# Patient Record
Sex: Male | Born: 1945 | Race: White | Hispanic: No | Marital: Married | Smoking: Never smoker
Health system: Southern US, Community
[De-identification: ages and names within clinical notes are randomized; demographics above are authoritative.]

## PROBLEM LIST (undated history)

## (undated) DIAGNOSIS — N200 Calculus of kidney: Secondary | ICD-10-CM

## (undated) DIAGNOSIS — M75100 Unspecified rotator cuff tear or rupture of unspecified shoulder, not specified as traumatic: Secondary | ICD-10-CM

## (undated) DIAGNOSIS — C8190 Hodgkin lymphoma, unspecified, unspecified site: Secondary | ICD-10-CM

---

## 2007-11-08 ENCOUNTER — Ambulatory Visit: Payer: Self-pay | Admitting: Gastroenterology

## 2009-05-12 ENCOUNTER — Ambulatory Visit: Payer: Self-pay | Admitting: Ophthalmology

## 2009-05-27 ENCOUNTER — Other Ambulatory Visit: Payer: Self-pay | Admitting: Unknown Physician Specialty

## 2010-06-05 ENCOUNTER — Ambulatory Visit: Payer: Self-pay | Admitting: Internal Medicine

## 2010-06-13 ENCOUNTER — Ambulatory Visit: Payer: Self-pay | Admitting: Urology

## 2010-06-25 ENCOUNTER — Inpatient Hospital Stay: Payer: Self-pay | Admitting: Internal Medicine

## 2011-04-13 ENCOUNTER — Ambulatory Visit: Payer: Self-pay | Admitting: Gastroenterology

## 2011-04-14 ENCOUNTER — Ambulatory Visit: Payer: Self-pay | Admitting: Surgery

## 2011-04-18 LAB — PATHOLOGY REPORT

## 2012-04-06 IMAGING — US ABDOMEN ULTRASOUND
1 series · 17 of 25 positions shown · non-contrast
Comparison: none

REASON FOR EXAM: ruq and renal
COMMENTS:   May transport without cardiac monitor

PROCEDURE:     US  - US ABDOMEN GENERAL SURVEY  - June 25, 2010  [DATE]
RESULT:     Comparison: None
TECHNIQUE: Multiple gray-scale and color-flow Doppler images of the abdomen
are presented for review.

[Series 1: abdomen ultrasound · 17 of 78 slices shown]
[im 1/78]
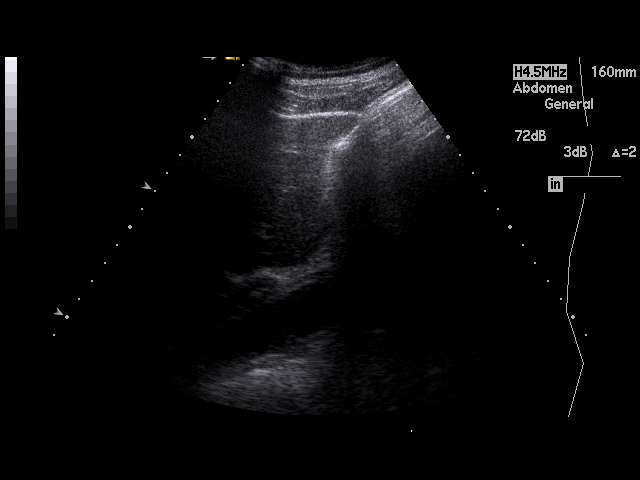
[im 7/78]
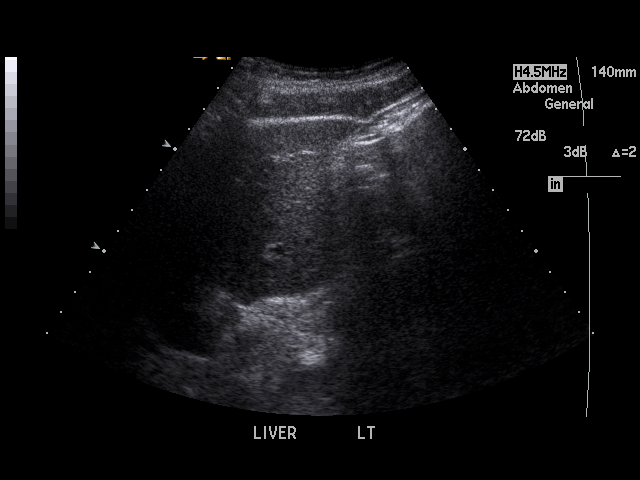
[im 10/78]
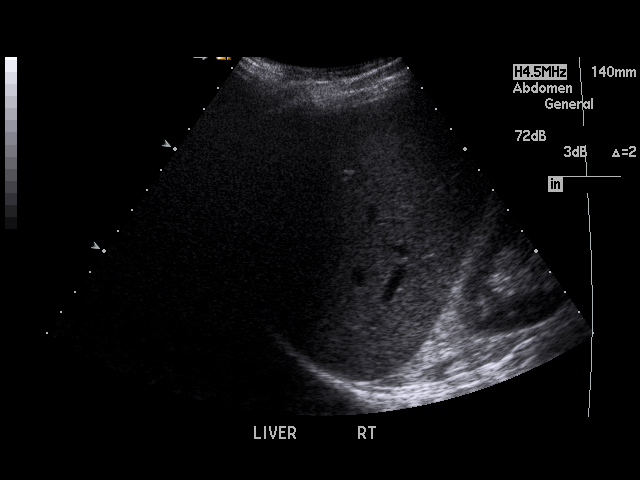
[im 17/78]
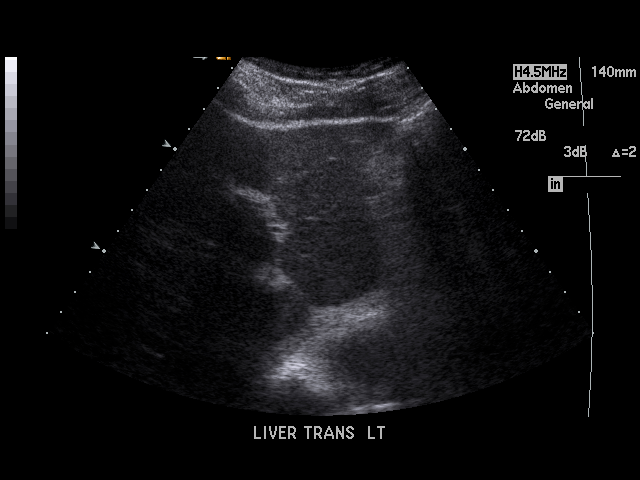
[im 20/78]
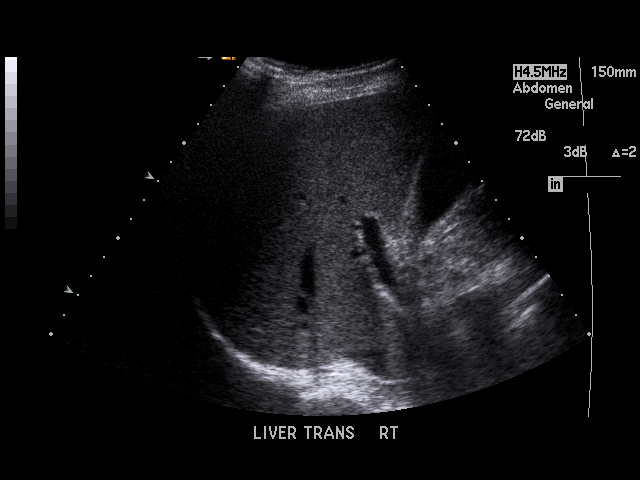
[im 26/78]
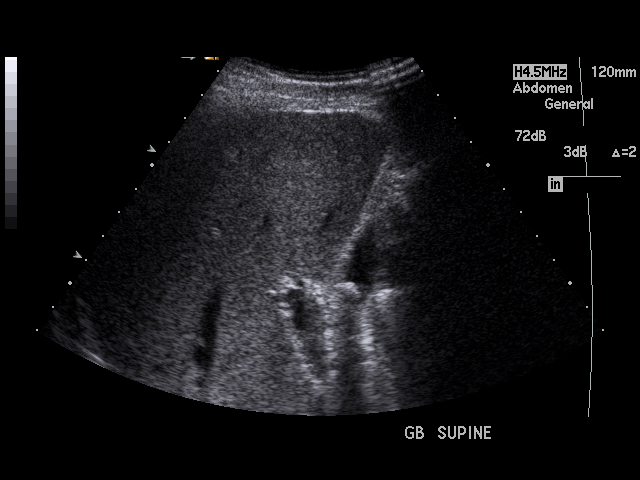
[im 29/78]
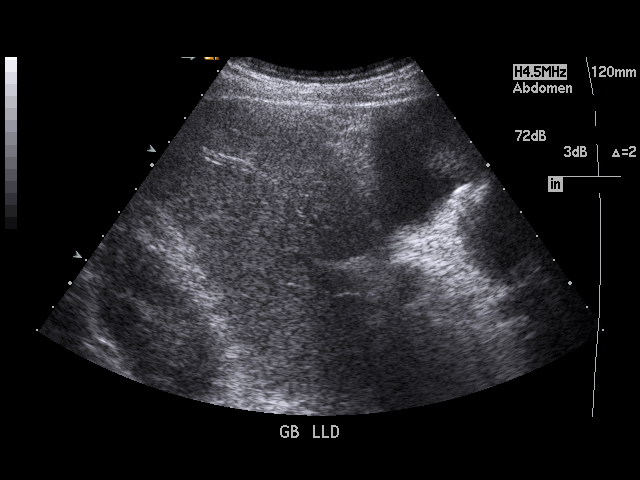
[im 36/78]
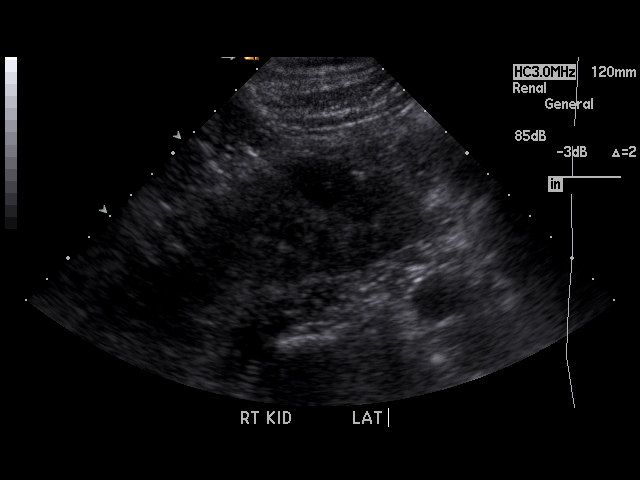
[im 39/78]
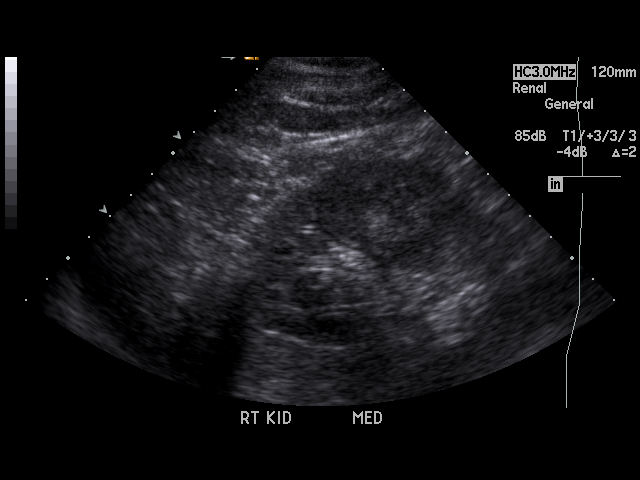
[im 42/78]
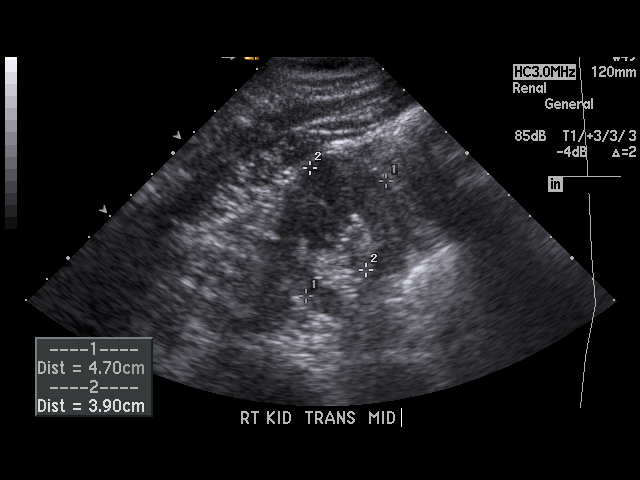
[im 49/78]
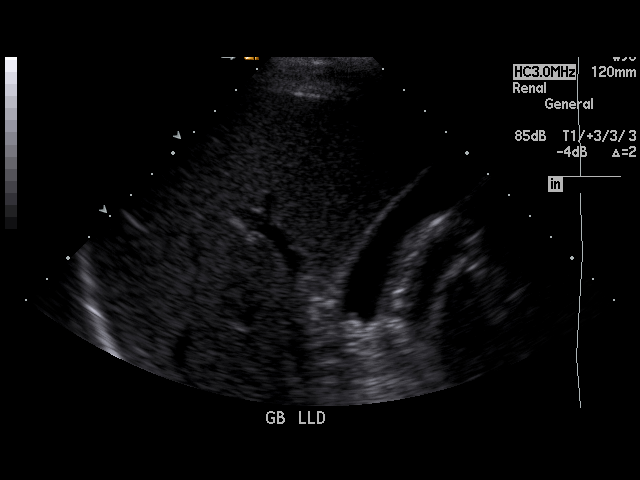
[im 52/78]
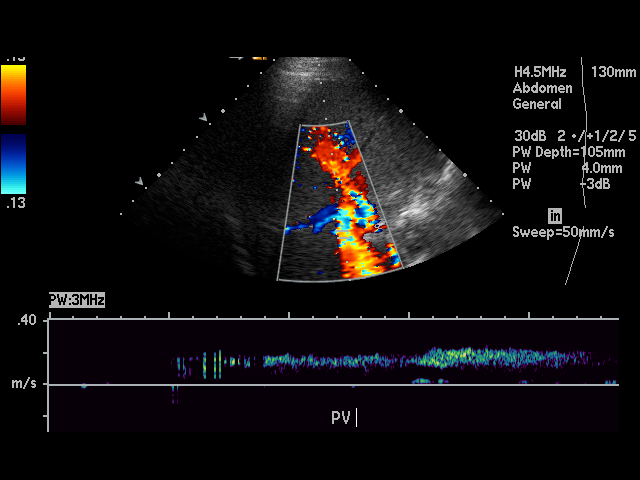
[im 58/78]
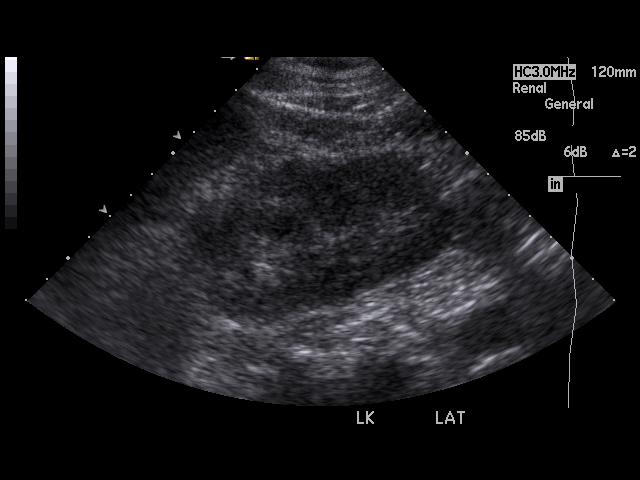
[im 61/78]
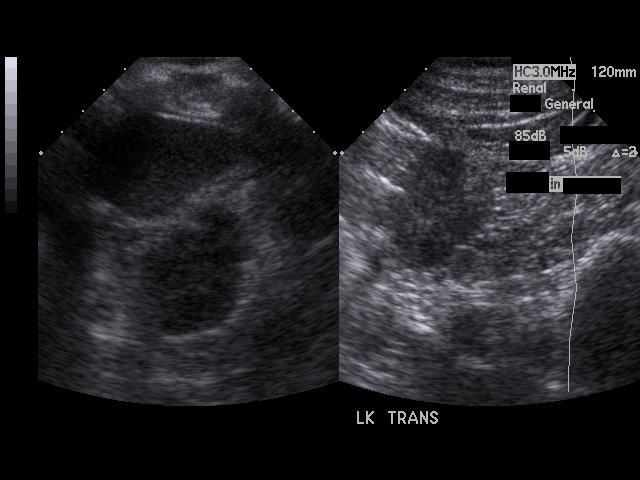
[im 68/78]
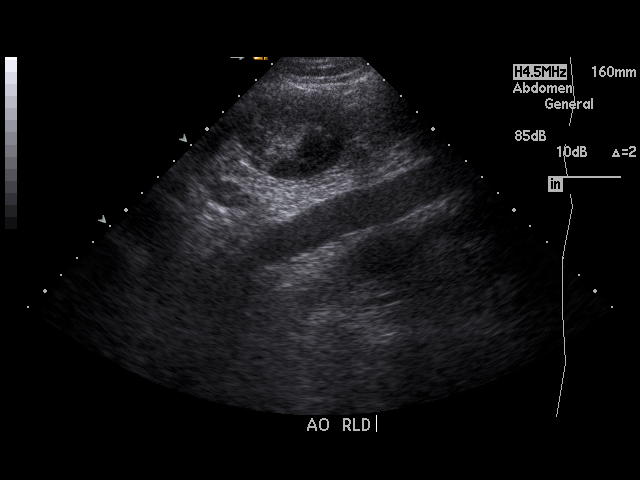
[im 71/78]
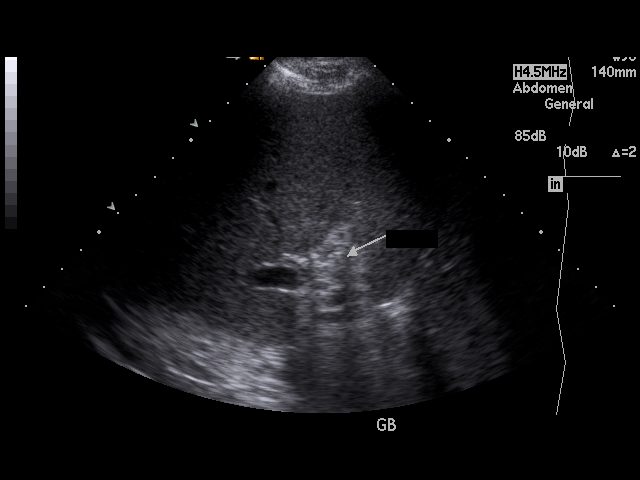
[im 78/78]
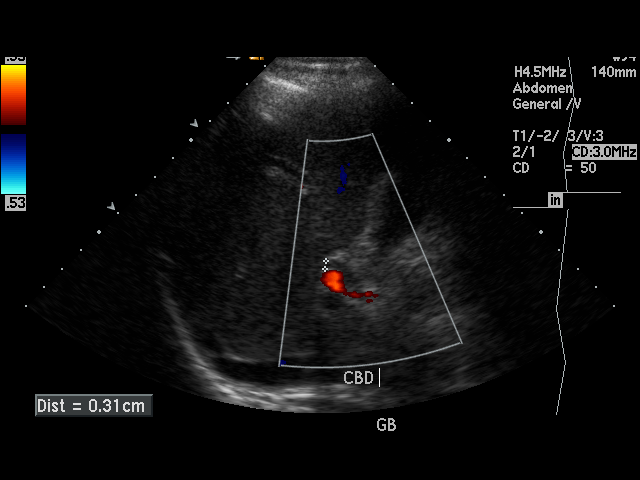

[17 of 25 positions shown; findings below may reference images not displayed]

FINDINGS: Visualized portions of the liver demonstrate normal echogenicity and normal
contours. The liver is without evidence of a focal hepatic lesion.

There are cholelithiasis. There is a nonmobile gallstone in the gallbladder
neck. There is no intra- or extrahepatic biliary ductal dilatation. The
common duct measures 4.3 mm in maximal diameter. There is no gallbladder
wall thickening, pericholecystic fluid, or sonographic Murphy's sign.

The visualized portion of the pancreas is normal in echogenicity. The spleen
is unremarkable. Bilateral kidneys are normal in echogenicity and size. The
right kidney measures 9.4 x 4.7 x 3.9 cm. The left kidney measures 10.7 x
5.3 x 4.3 cm. There is no obstructive uropathy. There is a nonobstructing
right renal calculus. There is a small 7 mm anechoic mass in the left kidney
likely resenting a small cyst. The abdominal aorta and IVC are unremarkable.
IMPRESSION: 1. Cholelithiasis without sonographic evidence of acute cholecystitis. There
is a nonmobile gallstone in the gallbladder neck.

2. Nonobstructing right nephrolithiasis.

## 2012-04-06 IMAGING — CT CT HEAD WITHOUT CONTRAST
2 series · 16 of 30 positions shown, 20 images · non-contrast
Comparison: none

REASON FOR EXAM: pain with moving eyes
COMMENTS:

PROCEDURE:     CT  - CT HEAD WITHOUT CONTRAST  - June 25, 2010 [DATE]
RESULT:     Comparison:  None
TECHNIQUE: Multiple axial images from the foramen magnum to the vertex were
obtained without IV contrast.

[Series 2: without · axial · non-contrast · 0.45mm/px · z∈[+492,+622]mm · 13 of 32 slices shown, 17 images]
[im 3/32  brain]
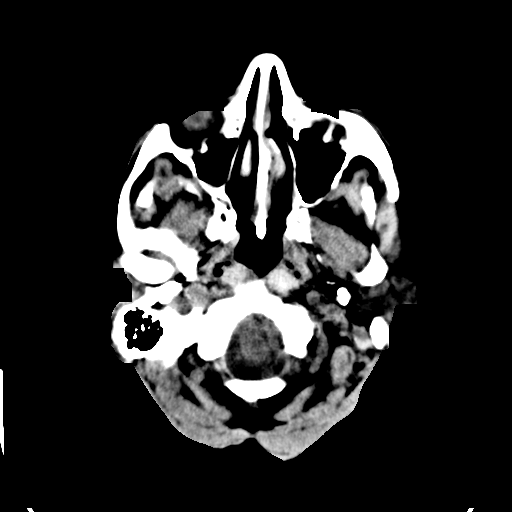
[im 3/32  bone]
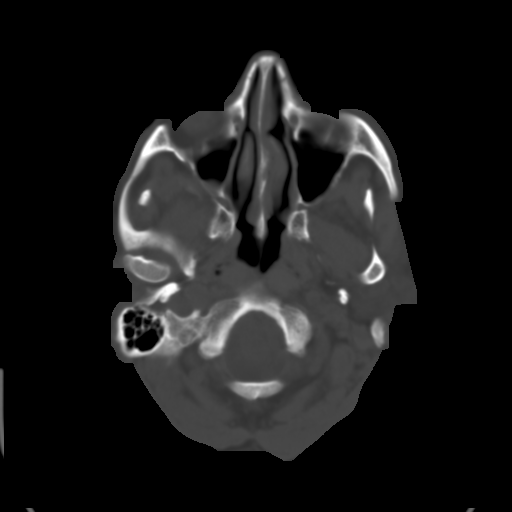
[im 5/32  brain]
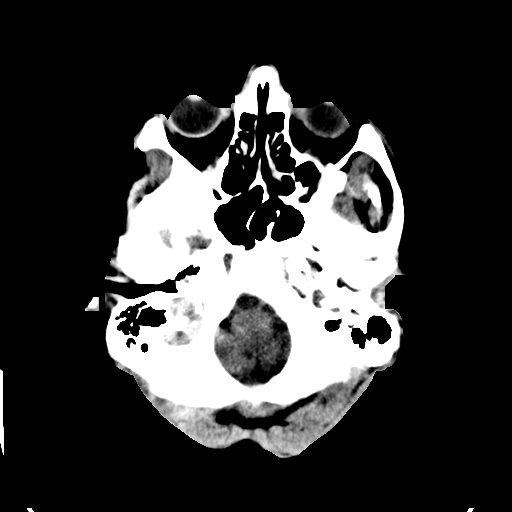
[im 7/32  brain]
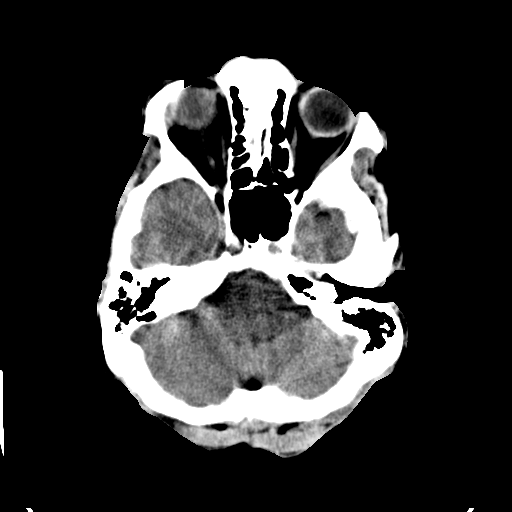
[im 9/32  brain]
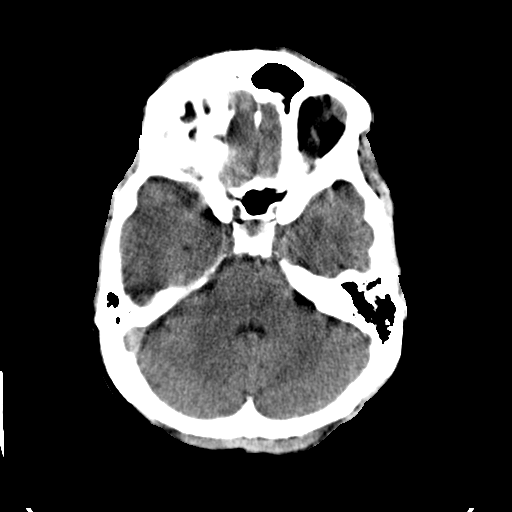
[im 12/32  brain]
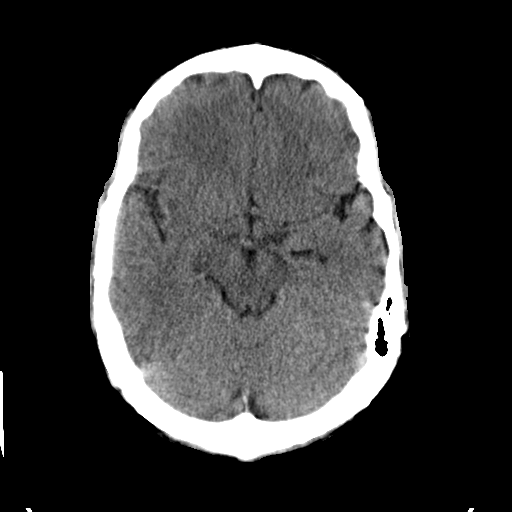
[im 12/32  bone]
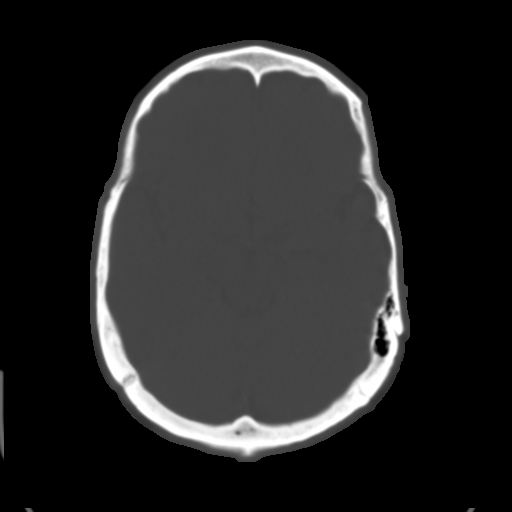
[im 14/32  brain]
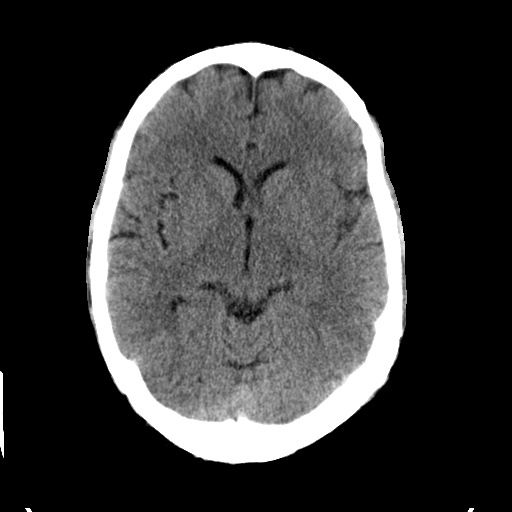
[im 16/32  brain]
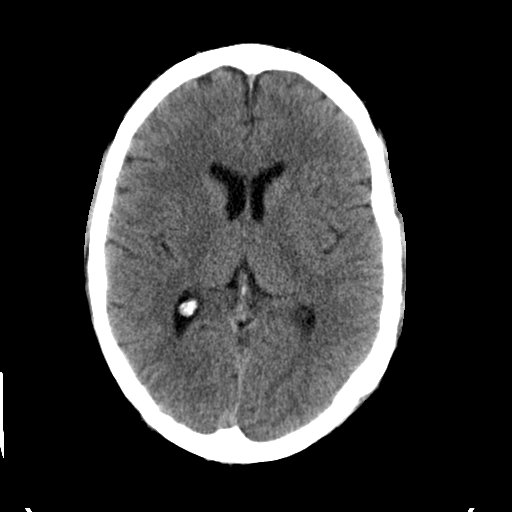
[im 18/32  brain]
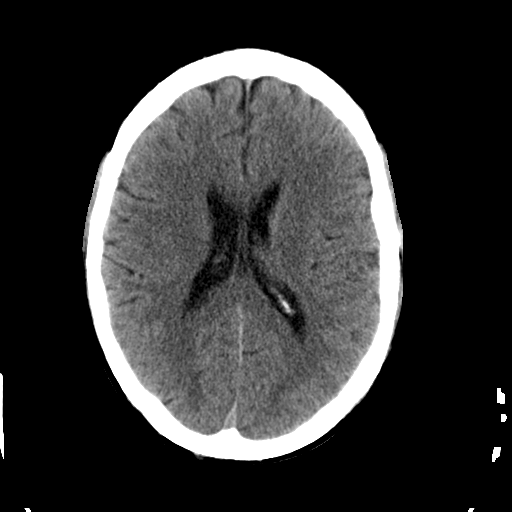
[im 20/32  brain]
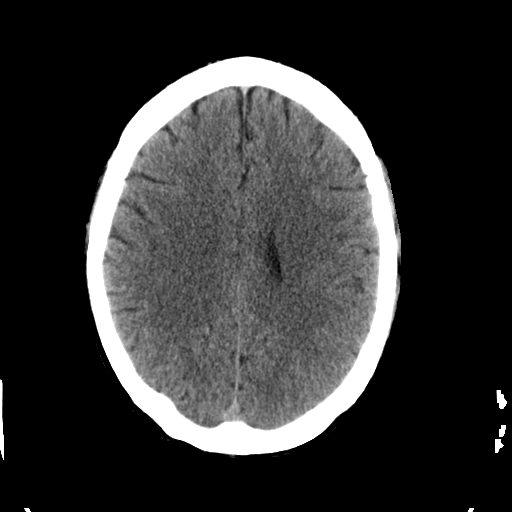
[im 20/32  bone]
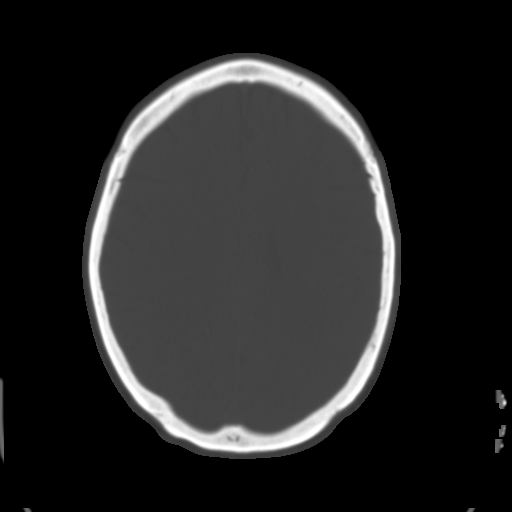
[im 23/32  brain]
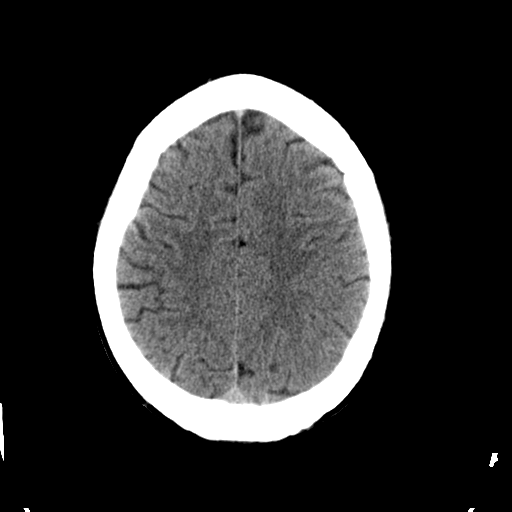
[im 25/32  brain]
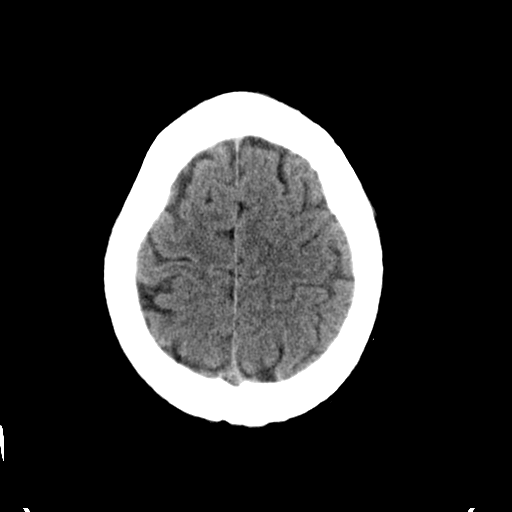
[im 27/32  brain]
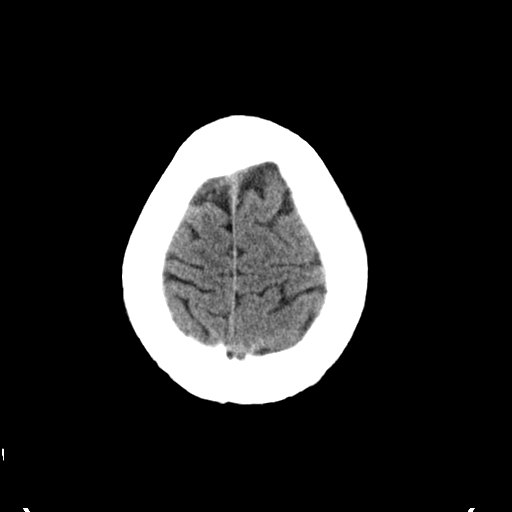
[im 29/32  brain]
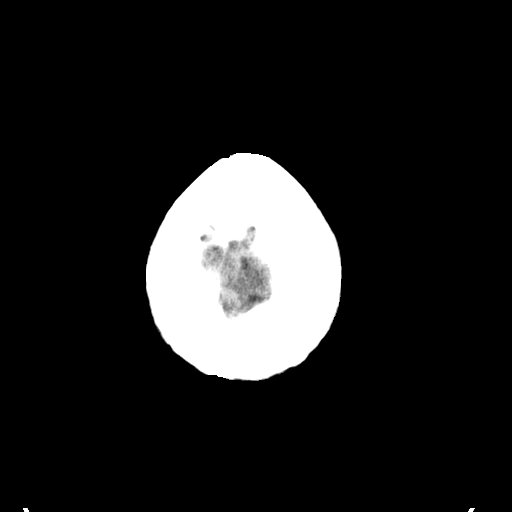
[im 29/32  bone]
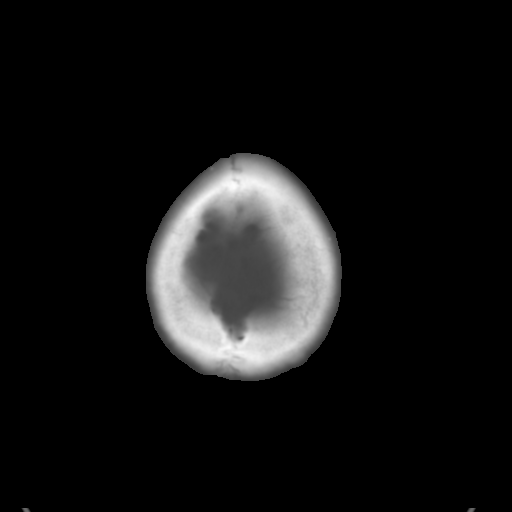

[Series 3: bone · axial · 0.45mm/px · z∈[+492,+536]mm · 3 of 32 slices shown]
[im 3/32  bone]
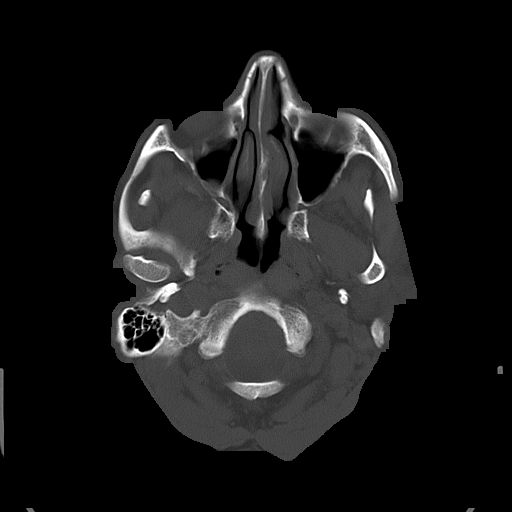
[im 7/32  bone]
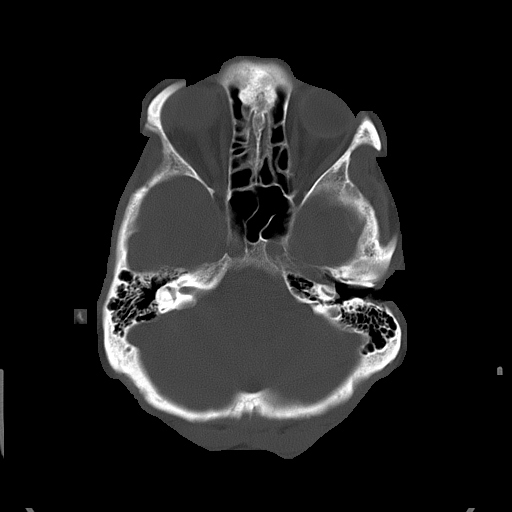
[im 12/32  bone]
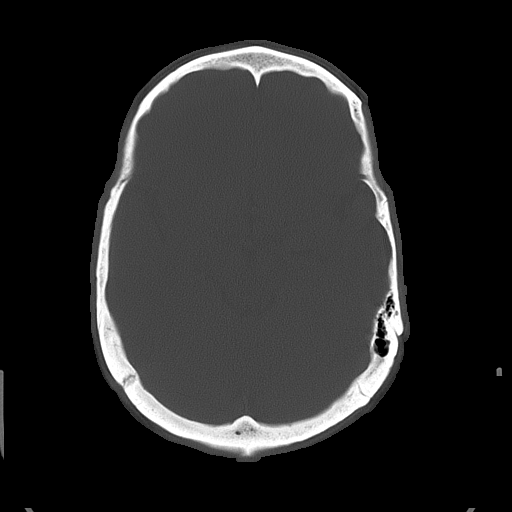

[16 of 30 positions shown; findings below may reference images not displayed]

FINDINGS: There is no evidence of mass effect, midline shift, or extra-axial fluid
collections.  There is no evidence of a space-occupying lesion or
intracranial hemorrhage. There is no evidence of a cortical-based area of
acute infarction.

The ventricles and sulci are appropriate for the patient's age. The basal
cisterns are patent.

Visualized portions of the orbits are unremarkable. The visualized portions
of the paranasal sinuses and mastoid air cells are unremarkable.

The osseous structures are unremarkable.
IMPRESSION: No acute intracranial process.

## 2012-04-08 IMAGING — NM NUCLEAR MEDICINE HEPATOHBILIARY INCLUDE GB
1 series · 9 of 9 positions shown · non-contrast
Comparison: none

REASON FOR EXAM: abnormal abdomina ct with stone in the gallbladder neck,
possible cystic duct ob
COMMENTS:

PROCEDURE:     NM  - NM HEPATOBILIARY IMAGE  - June 27, 2010  [DATE]
RESULT:     Following administration of 7.68 mCi of technetium 99m Choletec,
hepatobiliary scan is obtained. The liver is normal. Gallbladder visualizes
at 15 minutes. There is no evidence of biliary obstruction.

[Series 1000: gallbladder statics · 4.80mm/px · 9 of 9 slices shown]
[im 1/9]
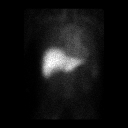
[im 2/9]
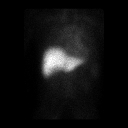
[im 3/9]
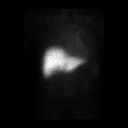
[im 4/9]
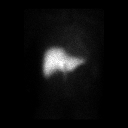
[im 5/9]
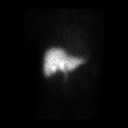
[im 6/9]
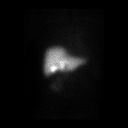
[im 7/9]
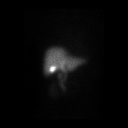
[im 8/9]
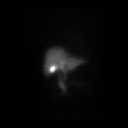
[im 9/9]
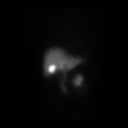

[9 of 9 positions shown; findings below may reference images not displayed]

IMPRESSION: Normal exam.

## 2014-01-21 ENCOUNTER — Ambulatory Visit: Payer: Self-pay | Admitting: Physician Assistant

## 2014-02-26 ENCOUNTER — Emergency Department: Payer: Self-pay | Admitting: Emergency Medicine

## 2014-02-26 LAB — CBC WITH DIFFERENTIAL/PLATELET
BASOS ABS: 0 10*3/uL (ref 0.0–0.1)
Basophil %: 0.3 %
Eosinophil #: 0.1 10*3/uL (ref 0.0–0.7)
Eosinophil %: 0.4 %
HCT: 49.6 % (ref 40.0–52.0)
HGB: 16.1 g/dL (ref 13.0–18.0)
LYMPHS PCT: 9.4 %
Lymphocyte #: 1.3 10*3/uL (ref 1.0–3.6)
MCH: 29.8 pg (ref 26.0–34.0)
MCHC: 32.4 g/dL (ref 32.0–36.0)
MCV: 92 fL (ref 80–100)
MONOS PCT: 7.5 %
Monocyte #: 1 x10 3/mm (ref 0.2–1.0)
Neutrophil #: 11.2 10*3/uL — ABNORMAL HIGH (ref 1.4–6.5)
Neutrophil %: 82.4 %
PLATELETS: 207 10*3/uL (ref 150–440)
RBC: 5.39 10*6/uL (ref 4.40–5.90)
RDW: 12.9 % (ref 11.5–14.5)
WBC: 13.6 10*3/uL — ABNORMAL HIGH (ref 3.8–10.6)

## 2014-02-26 LAB — COMPREHENSIVE METABOLIC PANEL
ANION GAP: 7 (ref 7–16)
AST: 35 U/L (ref 15–37)
Albumin: 4 g/dL (ref 3.4–5.0)
Alkaline Phosphatase: 142 U/L — ABNORMAL HIGH
BUN: 18 mg/dL (ref 7–18)
Bilirubin,Total: 1.4 mg/dL — ABNORMAL HIGH (ref 0.2–1.0)
CO2: 28 mmol/L (ref 21–32)
Calcium, Total: 9.3 mg/dL (ref 8.5–10.1)
Chloride: 104 mmol/L (ref 98–107)
Creatinine: 1.73 mg/dL — ABNORMAL HIGH (ref 0.60–1.30)
GFR CALC AF AMER: 51 — AB
GFR CALC NON AF AMER: 42 — AB
Glucose: 100 mg/dL — ABNORMAL HIGH (ref 65–99)
Osmolality: 280 (ref 275–301)
Potassium: 3.7 mmol/L (ref 3.5–5.1)
SGPT (ALT): 40 U/L
Sodium: 139 mmol/L (ref 136–145)
Total Protein: 7 g/dL (ref 6.4–8.2)

## 2014-02-26 LAB — URINALYSIS, COMPLETE
Bacteria: NONE SEEN
Bilirubin,UR: NEGATIVE
Glucose,UR: NEGATIVE mg/dL (ref 0–75)
Leukocyte Esterase: NEGATIVE
Nitrite: NEGATIVE
PH: 6 (ref 4.5–8.0)
Protein: NEGATIVE
RBC,UR: 21 /HPF (ref 0–5)
SPECIFIC GRAVITY: 1.017 (ref 1.003–1.030)

## 2014-02-26 LAB — LIPASE, BLOOD: LIPASE: 133 U/L (ref 73–393)

## 2014-03-02 ENCOUNTER — Ambulatory Visit: Payer: Self-pay | Admitting: Urology

## 2014-03-05 ENCOUNTER — Ambulatory Visit: Payer: Self-pay | Admitting: Urology

## 2014-04-02 ENCOUNTER — Ambulatory Visit: Payer: Self-pay | Admitting: Urology

## 2016-10-26 ENCOUNTER — Encounter: Payer: Self-pay | Admitting: *Deleted

## 2016-10-27 ENCOUNTER — Encounter
Admission: RE | Disposition: A | Discharge status: Home / Self Care | Payer: Self-pay | Source: Ambulatory Visit | Attending: Gastroenterology

## 2016-10-27 ENCOUNTER — Ambulatory Visit: Payer: BC Managed Care – PPO | Admitting: Anesthesiology

## 2016-10-27 ENCOUNTER — Encounter: Payer: Self-pay | Admitting: *Deleted

## 2016-10-27 ENCOUNTER — Ambulatory Visit
Admission: RE | Admit: 2016-10-27 | Discharge: 2016-10-27 | Disposition: A | Discharge status: Home / Self Care | Payer: BC Managed Care – PPO | Source: Ambulatory Visit | Attending: Gastroenterology | Admitting: Gastroenterology

## 2016-10-27 DIAGNOSIS — K635 Polyp of colon: Secondary | ICD-10-CM | POA: Insufficient documentation

## 2016-10-27 DIAGNOSIS — Z8571 Personal history of Hodgkin lymphoma: Secondary | ICD-10-CM | POA: Diagnosis not present

## 2016-10-27 DIAGNOSIS — K5730 Diverticulosis of large intestine without perforation or abscess without bleeding: Secondary | ICD-10-CM | POA: Insufficient documentation

## 2016-10-27 DIAGNOSIS — K648 Other hemorrhoids: Secondary | ICD-10-CM | POA: Insufficient documentation

## 2016-10-27 DIAGNOSIS — Z1211 Encounter for screening for malignant neoplasm of colon: Secondary | ICD-10-CM | POA: Insufficient documentation

## 2016-10-27 DIAGNOSIS — Z86010 Personal history of colonic polyps: Secondary | ICD-10-CM | POA: Diagnosis not present

## 2016-10-27 HISTORY — PX: COLONOSCOPY WITH PROPOFOL: SHX5780

## 2016-10-27 HISTORY — DX: Hodgkin lymphoma, unspecified, unspecified site: C81.90

## 2016-10-27 HISTORY — DX: Unspecified rotator cuff tear or rupture of unspecified shoulder, not specified as traumatic: M75.100

## 2016-10-27 SURGERY — COLONOSCOPY WITH PROPOFOL
Anesthesia: General

## 2016-10-27 MED ORDER — FENTANYL CITRATE (PF) 100 MCG/2ML IJ SOLN
INTRAMUSCULAR | Status: DC | PRN
  Administered 2016-10-27: 50 ug via INTRAVENOUS

## 2016-10-27 MED ORDER — SODIUM CHLORIDE 0.9 % IV SOLN: INTRAVENOUS | Status: DC

## 2016-10-27 MED ORDER — FENTANYL CITRATE (PF) 100 MCG/2ML IJ SOLN
INTRAMUSCULAR | Status: AC
  Filled 2016-10-27: qty 2

## 2016-10-27 MED ORDER — MIDAZOLAM HCL 2 MG/2ML IJ SOLN
INTRAMUSCULAR | Status: AC
  Filled 2016-10-27: qty 2

## 2016-10-27 MED ORDER — PHENYLEPHRINE HCL 10 MG/ML IJ SOLN
INTRAMUSCULAR | Status: AC
  Filled 2016-10-27: qty 1

## 2016-10-27 MED ORDER — EPHEDRINE SULFATE 50 MG/ML IJ SOLN
INTRAMUSCULAR | Status: DC | PRN
  Administered 2016-10-27 (×2): 10 mg via INTRAVENOUS

## 2016-10-27 MED ORDER — EPHEDRINE SULFATE 50 MG/ML IJ SOLN
INTRAMUSCULAR | Status: AC
  Filled 2016-10-27: qty 1

## 2016-10-27 MED ORDER — PROPOFOL 10 MG/ML IV BOLUS
INTRAVENOUS | Status: DC | PRN
  Administered 2016-10-27: 30 mg via INTRAVENOUS

## 2016-10-27 MED ORDER — PROPOFOL 500 MG/50ML IV EMUL
INTRAVENOUS | Status: DC | PRN
  Administered 2016-10-27: 140 ug/kg/min via INTRAVENOUS

## 2016-10-27 MED ORDER — MIDAZOLAM HCL 2 MG/2ML IJ SOLN
INTRAMUSCULAR | Status: DC | PRN
  Administered 2016-10-27: 1 mg via INTRAVENOUS

## 2016-10-27 MED ORDER — SODIUM CHLORIDE 0.9 % IV SOLN
INTRAVENOUS | Status: DC
  Administered 2016-10-27 (×2): via INTRAVENOUS

## 2016-10-27 NOTE — Anesthesia Preprocedure Evaluation (Signed)
Anesthesia Evaluation  Patient identified by MRN, date of birth, ID band Patient awake    Reviewed: Allergy & Precautions, NPO status , Patient's Chart, lab work & pertinent test results  History of Anesthesia Complications Negative for: history of anesthetic complications  Airway Mallampati: II       Dental  (+) Poor Dentition   Pulmonary neg pulmonary ROS, neg sleep apnea, neg COPD,    breath sounds clear to auscultation- rhonchi (-) wheezing      Cardiovascular Exercise Tolerance: Good (-) hypertension(-) CAD and (-) Past MI  Rhythm:Regular Rate:Normal - Systolic murmurs and - Diastolic murmurs    Neuro/Psych negative neurological ROS  negative psych ROS   GI/Hepatic negative GI ROS, Neg liver ROS,   Endo/Other  negative endocrine ROS  Renal/GU negative Renal ROS     Musculoskeletal negative musculoskeletal ROS (+)   Abdominal (+) - obese,   Peds  Hematology negative hematology ROS (+)   Anesthesia Other Findings Past Medical History: No date: Hodgkin lymphoma ([place]) No date: Rotator cuff tear     Comment: left   Reproductive/Obstetrics                             Anesthesia Physical Anesthesia Plan  ASA: II  Anesthesia Plan: General   Post-op Pain Management:    Induction: Intravenous  Airway Management Planned: Natural Airway  Additional Equipment:   Intra-op Plan:   Post-operative Plan:   Informed Consent: I have reviewed the patients History and Physical, chart, labs and discussed the procedure including the risks, benefits and alternatives for the proposed anesthesia with the patient or authorized representative who has indicated his/her understanding and acceptance.   Dental advisory given  Plan Discussed with: CRNA and Anesthesiologist  Anesthesia Plan Comments:         Anesthesia Quick Evaluation

## 2016-10-27 NOTE — Anesthesia Post-op Follow-up Note (Cosign Needed)
Anesthesia QCDR form completed.        

## 2016-10-27 NOTE — Transfer of Care (Signed)
Immediate Anesthesia Transfer of Care Note  Patient: Jon Ochoa  Procedure(s) Performed: Procedure(s): COLONOSCOPY WITH PROPOFOL (N/A)  Patient Location: PACU  Anesthesia Type:General  Level of Consciousness: sedated  Airway & Oxygen Therapy: Patient Spontanous Breathing and Patient connected to nasal cannula oxygen  Post-op Assessment: Report given to RN and Post -op Vital signs reviewed and stable  Post vital signs: Reviewed and stable  Last Vitals:  Vitals:   10/27/16 1235  BP: (!) 111/58  Pulse: (!) 53  Resp: 18  Temp: 36.4 C    Last Pain:  Vitals:   10/27/16 1235  TempSrc: Tympanic         Complications: No apparent anesthesia complications

## 2016-10-27 NOTE — Anesthesia Procedure Notes (Signed)
Date/Time: 10/27/2016 1:40 PM Performed by: Allean Found Pre-anesthesia Checklist: Patient identified, Emergency Drugs available, Suction available, Patient being monitored and Timeout performed Oxygen Delivery Method: Nasal cannula Placement Confirmation: positive ETCO2

## 2016-10-27 NOTE — H&P (Signed)
Outpatient short stay form Pre-procedure 10/27/2016 1:22 PM Lollie Sails MD  Primary Physician: Dr. Harland German  Reason for visit:  Colonoscopy  History of present illness:  Patient is a 71 year old male presenting today as above. He has a personal history of adenomatous colon polyps. Several months ago he did have some episodes of rectal bleeding that seems to have abated. He takes no aspirin products or blood thinning agents.    Current Facility-Administered Medications:  .  0.9 %  sodium chloride infusion, , Intravenous, Continuous, Lollie Sails, MD, Last Rate: 20 mL/hr at 10/27/16 1256 .  0.9 %  sodium chloride infusion, , Intravenous, Continuous, Lollie Sails, MD  No prescriptions prior to admission.     No Known Allergies   Past Medical History:  Diagnosis Date  . Hodgkin lymphoma ([place])   . Rotator cuff tear    left    Review of systems:      Physical Exam    Heart and lungs: Regular rate and rhythm without rub or gallop, lungs are bilaterally clear    HEENT: Normocephalic atraumatic eyes are anicteric    Other:     Pertinant exam for procedure: Soft nontender nondistended bowel sounds positive normoactive.    Planned proceedures: Colonoscopy and indicated procedures. I have discussed the risks benefits and complications of procedures to include not limited to bleeding, infection, perforation and the risk of sedation and the patient wishes to proceed.    Lollie Sails, MD Gastroenterology 10/27/2016  1:22 PM

## 2016-10-27 NOTE — Anesthesia Postprocedure Evaluation (Signed)
Anesthesia Post Note  Patient: Jon Ochoa  Procedure(s) Performed: Procedure(s) (LRB): COLONOSCOPY WITH PROPOFOL (N/A)  Patient location during evaluation: Endoscopy Anesthesia Type: General Level of consciousness: awake and alert and oriented Pain management: pain level controlled Vital Signs Assessment: post-procedure vital signs reviewed and stable Respiratory status: spontaneous breathing, nonlabored ventilation and respiratory function stable Cardiovascular status: blood pressure returned to baseline and stable Postop Assessment: no signs of nausea or vomiting Anesthetic complications: no     Last Vitals:  Vitals:   10/27/16 1415 10/27/16 1425  BP: 98/68 124/74  Pulse: 86 79  Resp: (!) 21 15  Temp: 36.4 C     Last Pain:  Vitals:   10/27/16 1415  TempSrc: Tympanic                 Laria Grimmett

## 2016-10-27 NOTE — Op Note (Signed)
[facility] Gastroenterology Patient Name: Jon Ochoa Procedure Date: 10/27/2016 1:29 PM MRN: [id number] Account #: [id number] Date of Birth: 03/15/1946 Admit Type: Outpatient Age: 71 Room: [facility] ENDO ROOM 1 Gender: Male Note Status: Finalized Procedure:            Colonoscopy Indications:          Personal history of colonic polyps Providers:            Lollie Sails, MD Medicines:            Monitored Anesthesia Care Complications:        No immediate complications. Procedure:            Pre-Anesthesia Assessment:                       - ASA Grade Assessment: III - A patient with severe                        systemic disease.                       After obtaining informed consent, the colonoscope was                        passed under direct vision. Throughout the procedure,                        the patient's blood pressure, pulse, and oxygen                        saturations were monitored continuously. The                        Colonoscope was introduced through the anus and                        advanced to the the terminal ileum. The colonoscopy was                        performed without difficulty. The patient tolerated the                        procedure well. The quality of the bowel preparation                        was good except the ascending colon was fair. Findings:      A 2 mm polyp was found in the distal sigmoid colon. The polyp was       sessile. The polyp was removed with a cold biopsy forceps. Resection and       retrieval were complete.      Multiple small to medium-mouthed diverticula were found in the sigmoid       colon, descending colon and transverse colon.      there is a nodular appearing mucosa noted throughout the right colon,       present but much less obvious in portions of the left colon.      Biopsies for histology were taken with a cold forceps from the right       colon and left colon for evaluation of  microscopic colitis.      A 2 mm polyp was  found in the mid sigmoid colon. The polyp was sessile.       The polyp was removed with a cold biopsy forceps. Resection and       retrieval were complete.      Non-bleeding internal hemorrhoids were found during retroflexion and       during anoscopy. The hemorrhoids were small and Grade I (internal       hemorrhoids that do not prolapse).      No additional abnormalities were found on retroflexion.      The digital rectal exam was normal.      the terminal ileum as intubated for about 2 cm, however angulation       prevented further movement with several efforts. Appearance was normal. Impression:           - One 2 mm polyp in the distal sigmoid colon, removed                        with a cold biopsy forceps. Resected and retrieved.                       - Diverticulosis in the sigmoid colon, in the                        descending colon and in the transverse colon.                       - One 2 mm polyp in the mid sigmoid colon, removed with                        a cold biopsy forceps. Resected and retrieved.                       - Non-bleeding internal hemorrhoids.                       - Biopsies were taken with a cold forceps from the                        right colon and left colon for evaluation of                        microscopic colitis. Recommendation:       - Await pathology results.                       - Telephone GI clinic for pathology results in 1 week.                       - Use Citrucel one tablespoon PO daily daily. Procedure Code(s):    --- Professional ---                       [phone], Colonoscopy, flexible; with biopsy, single or                        multiple Diagnosis Code(s):    --- Professional ---                       D12.5, Benign neoplasm of sigmoid colon  K64.0, First degree hemorrhoids                       Z86.010, Personal history of colonic polyps                       K57.30,  Diverticulosis of large intestine without                        perforation or abscess without bleeding CPT copyright 2016 American Medical Association. All rights reserved. The codes documented in this report are preliminary and upon coder review may  be revised to meet current compliance requirements. Lollie Sails, MD 10/27/2016 2:13:13 PM This report has been signed electronically. Number of Addenda: 0 Note Initiated On: 10/27/2016 1:29 PM Scope Withdrawal Time: 0 hours 15 minutes 42 seconds  Total Procedure Duration: 0 hours 27 minutes 42 seconds       [facility]

## 2016-10-31 ENCOUNTER — Encounter: Payer: Self-pay | Admitting: Gastroenterology

## 2016-11-01 LAB — SURGICAL PATHOLOGY

## 2017-03-29 ENCOUNTER — Encounter: Payer: Self-pay | Admitting: *Deleted

## 2017-03-29 ENCOUNTER — Ambulatory Visit
Admission: RE | Admit: 2017-03-29 | Discharge: 2017-03-29 | Disposition: A | Discharge status: Home / Self Care | Payer: BC Managed Care – PPO | Source: Ambulatory Visit | Attending: Urology | Admitting: Urology

## 2017-03-29 ENCOUNTER — Encounter
Admission: RE | Disposition: A | Discharge status: Home / Self Care | Payer: Self-pay | Source: Ambulatory Visit | Attending: Urology

## 2017-03-29 DIAGNOSIS — Z8571 Personal history of Hodgkin lymphoma: Secondary | ICD-10-CM | POA: Insufficient documentation

## 2017-03-29 DIAGNOSIS — Z923 Personal history of irradiation: Secondary | ICD-10-CM | POA: Insufficient documentation

## 2017-03-29 DIAGNOSIS — N200 Calculus of kidney: Secondary | ICD-10-CM | POA: Insufficient documentation

## 2017-03-29 DIAGNOSIS — N201 Calculus of ureter: Secondary | ICD-10-CM

## 2017-03-29 HISTORY — PX: EXTRACORPOREAL SHOCK WAVE LITHOTRIPSY: SHX1557

## 2017-03-29 SURGERY — LITHOTRIPSY, ESWL
Anesthesia: Moderate Sedation | Laterality: Right

## 2017-03-29 MED ORDER — LEVOFLOXACIN 500 MG PO TABS
500.0000 mg | ORAL_TABLET | ORAL | Status: AC
  Administered 2017-03-29: 500 mg via ORAL

## 2017-03-29 MED ORDER — DIPHENHYDRAMINE HCL 25 MG PO CAPS
ORAL_CAPSULE | ORAL | Status: AC
  Administered 2017-03-29: 25 mg via ORAL
  Filled 2017-03-29: qty 1

## 2017-03-29 MED ORDER — LEVOFLOXACIN 500 MG PO TABS
ORAL_TABLET | ORAL | Status: AC
  Administered 2017-03-29: 500 mg via ORAL
  Filled 2017-03-29: qty 1

## 2017-03-29 MED ORDER — DEXTROSE-NACL 5-0.45 % IV SOLN
INTRAVENOUS | Status: DC
  Administered 2017-03-29: 12:00:00 via INTRAVENOUS

## 2017-03-29 MED ORDER — MORPHINE SULFATE (PF) 10 MG/ML IV SOLN
10.0000 mg | Freq: Once | INTRAVENOUS | Status: AC
  Administered 2017-03-29: 10 mg via INTRAVENOUS

## 2017-03-29 MED ORDER — PROMETHAZINE HCL 25 MG/ML IJ SOLN
25.0000 mg | Freq: Once | INTRAMUSCULAR | Status: AC
  Administered 2017-03-29: 25 mg via INTRAMUSCULAR

## 2017-03-29 MED ORDER — FUROSEMIDE 10 MG/ML IJ SOLN
INTRAMUSCULAR | Status: AC
  Administered 2017-03-29: 10 mg via INTRAVENOUS
  Filled 2017-03-29: qty 2

## 2017-03-29 MED ORDER — FUROSEMIDE 10 MG/ML IJ SOLN
10.0000 mg | Freq: Once | INTRAMUSCULAR | Status: DC
  Administered 2017-03-29: 10 mg via INTRAVENOUS

## 2017-03-29 MED ORDER — MORPHINE SULFATE (PF) 10 MG/ML IV SOLN
INTRAVENOUS | Status: AC
  Administered 2017-03-29: 10 mg via INTRAVENOUS
  Filled 2017-03-29: qty 1

## 2017-03-29 MED ORDER — ONDANSETRON 8 MG PO TBDP: 4.0000 mg | ORAL_TABLET | Freq: Four times a day (QID) | ORAL | 3 refills | Status: DC | PRN

## 2017-03-29 MED ORDER — MIDAZOLAM HCL 2 MG/2ML IJ SOLN
1.0000 mg | Freq: Once | INTRAMUSCULAR | Status: AC
  Administered 2017-03-29: 1 mg via INTRAMUSCULAR

## 2017-03-29 MED ORDER — TAMSULOSIN HCL 0.4 MG PO CAPS: 0.4000 mg | ORAL_CAPSULE | Freq: Every day | ORAL | 1 refills | Status: AC

## 2017-03-29 MED ORDER — MIDAZOLAM HCL 2 MG/2ML IJ SOLN
INTRAMUSCULAR | Status: AC
  Administered 2017-03-29: 1 mg via INTRAMUSCULAR
  Filled 2017-03-29: qty 2

## 2017-03-29 MED ORDER — TAPENTADOL HCL 50 MG PO TABS: 50.0000 mg | ORAL_TABLET | Freq: Four times a day (QID) | ORAL | 0 refills | Status: AC | PRN

## 2017-03-29 MED ORDER — PROMETHAZINE HCL 25 MG/ML IJ SOLN
INTRAMUSCULAR | Status: AC
  Administered 2017-03-29: 25 mg via INTRAMUSCULAR
  Filled 2017-03-29: qty 1

## 2017-03-29 MED ORDER — DIPHENHYDRAMINE HCL 25 MG PO CAPS
25.0000 mg | ORAL_CAPSULE | ORAL | Status: AC
  Administered 2017-03-29: 25 mg via ORAL

## 2017-03-30 ENCOUNTER — Encounter: Payer: Self-pay | Admitting: Urology

## 2017-05-02 MED ORDER — LEVOFLOXACIN 500 MG PO TABS
500.0000 mg | ORAL_TABLET | Freq: Once | ORAL | Status: AC
  Administered 2017-05-03: 500 mg via ORAL

## 2017-05-03 ENCOUNTER — Ambulatory Visit
Admission: RE | Admit: 2017-05-03 | Discharge: 2017-05-03 | Disposition: A | Discharge status: Home / Self Care | Payer: BC Managed Care – PPO | Source: Ambulatory Visit | Attending: Urology | Admitting: Urology

## 2017-05-03 ENCOUNTER — Encounter
Admission: RE | Disposition: A | Discharge status: Home / Self Care | Payer: Self-pay | Source: Ambulatory Visit | Attending: Urology

## 2017-05-03 DIAGNOSIS — C8170 Other classical Hodgkin lymphoma, unspecified site: Secondary | ICD-10-CM | POA: Insufficient documentation

## 2017-05-03 DIAGNOSIS — N200 Calculus of kidney: Secondary | ICD-10-CM | POA: Insufficient documentation

## 2017-05-03 HISTORY — PX: EXTRACORPOREAL SHOCK WAVE LITHOTRIPSY: SHX1557

## 2017-05-03 SURGERY — LITHOTRIPSY, ESWL
Anesthesia: Moderate Sedation | Laterality: Left

## 2017-05-03 MED ORDER — MORPHINE SULFATE (PF) 2 MG/ML IV SOLN
10.0000 mg | Freq: Once | INTRAVENOUS | Status: DC
  Filled 2017-05-03: qty 5

## 2017-05-03 MED ORDER — DIPHENHYDRAMINE HCL 25 MG PO CAPS
ORAL_CAPSULE | ORAL | Status: AC
  Administered 2017-05-03: 25 mg via ORAL
  Filled 2017-05-03: qty 1

## 2017-05-03 MED ORDER — LEVOFLOXACIN 500 MG PO TABS
ORAL_TABLET | ORAL | Status: AC
  Administered 2017-05-03: 500 mg via ORAL
  Filled 2017-05-03: qty 1

## 2017-05-03 MED ORDER — FUROSEMIDE 10 MG/ML IJ SOLN
INTRAMUSCULAR | Status: AC
  Administered 2017-05-03: 10 mg
  Filled 2017-05-03: qty 2

## 2017-05-03 MED ORDER — PROMETHAZINE HCL 25 MG/ML IJ SOLN
25.0000 mg | Freq: Once | INTRAMUSCULAR | Status: AC
  Administered 2017-05-03: 25 mg via INTRAMUSCULAR

## 2017-05-03 MED ORDER — MORPHINE SULFATE (PF) 10 MG/ML IV SOLN
INTRAVENOUS | Status: AC
  Administered 2017-05-03: 10 mg via INTRAMUSCULAR
  Filled 2017-05-03: qty 1

## 2017-05-03 MED ORDER — MIDAZOLAM HCL 2 MG/2ML IJ SOLN
1.0000 mg | Freq: Once | INTRAMUSCULAR | Status: AC
  Administered 2017-05-03: 1 mg via INTRAMUSCULAR

## 2017-05-03 MED ORDER — NUCYNTA 50 MG PO TABS: 50.0000 mg | ORAL_TABLET | Freq: Four times a day (QID) | ORAL | 0 refills | Status: AC | PRN

## 2017-05-03 MED ORDER — DEXTROSE-NACL 5-0.45 % IV SOLN
INTRAVENOUS | Status: DC
  Administered 2017-05-03: 12:00:00 via INTRAVENOUS

## 2017-05-03 MED ORDER — ONDANSETRON 8 MG PO TBDP: 4.0000 mg | ORAL_TABLET | Freq: Four times a day (QID) | ORAL | 3 refills | Status: AC | PRN

## 2017-05-03 MED ORDER — PROMETHAZINE HCL 25 MG/ML IJ SOLN
INTRAMUSCULAR | Status: AC
  Administered 2017-05-03: 25 mg via INTRAMUSCULAR
  Filled 2017-05-03: qty 1

## 2017-05-03 MED ORDER — DIPHENHYDRAMINE HCL 25 MG PO CAPS
25.0000 mg | ORAL_CAPSULE | Freq: Once | ORAL | Status: AC
  Administered 2017-05-03: 25 mg via ORAL

## 2017-05-03 MED ORDER — TAMSULOSIN HCL 0.4 MG PO CAPS: 0.4000 mg | ORAL_CAPSULE | Freq: Every day | ORAL | 1 refills | Status: AC

## 2017-05-03 MED ORDER — MIDAZOLAM HCL 2 MG/2ML IJ SOLN
INTRAMUSCULAR | Status: AC
  Administered 2017-05-03: 1 mg via INTRAMUSCULAR
  Filled 2017-05-03: qty 2

## 2017-05-03 NOTE — Discharge Instructions (Signed)

## 2017-05-04 ENCOUNTER — Encounter: Payer: Self-pay | Admitting: Urology

## 2018-03-30 ENCOUNTER — Other Ambulatory Visit: Payer: Self-pay

## 2018-03-30 ENCOUNTER — Encounter: Payer: Self-pay | Admitting: Gynecology

## 2018-03-30 ENCOUNTER — Ambulatory Visit: Payer: BC Managed Care – PPO

## 2018-03-30 ENCOUNTER — Ambulatory Visit
Admission: EM | Admit: 2018-03-30 | Discharge: 2018-03-30 | Disposition: A | Discharge status: Home / Self Care | Payer: BC Managed Care – PPO | Attending: Family Medicine | Admitting: Family Medicine

## 2018-03-30 DIAGNOSIS — M546 Pain in thoracic spine: Secondary | ICD-10-CM | POA: Diagnosis not present

## 2018-03-30 DIAGNOSIS — M62830 Muscle spasm of back: Secondary | ICD-10-CM | POA: Diagnosis not present

## 2018-03-30 HISTORY — DX: Calculus of kidney: N20.0

## 2018-03-30 LAB — COMPREHENSIVE METABOLIC PANEL
ALT: 25 U/L (ref 0–44)
ANION GAP: 9 (ref 5–15)
AST: 27 U/L (ref 15–41)
Albumin: 4.4 g/dL (ref 3.5–5.0)
Alkaline Phosphatase: 122 U/L (ref 38–126)
BUN: 16 mg/dL (ref 8–23)
CALCIUM: 9.4 mg/dL (ref 8.9–10.3)
CO2: 26 mmol/L (ref 22–32)
Chloride: 106 mmol/L (ref 98–111)
Creatinine, Ser: 1.14 mg/dL (ref 0.61–1.24)
GFR calc non Af Amer: >60 mL/min (ref >60)
Glucose, Bld: 106 mg/dL — ABNORMAL HIGH (ref 70–99)
Potassium: 3.7 mmol/L (ref 3.5–5.1)
SODIUM: 141 mmol/L (ref 135–145)
Total Bilirubin: 1.3 mg/dL — ABNORMAL HIGH (ref 0.3–1.2)
Total Protein: 7.1 g/dL (ref 6.5–8.1)

## 2018-03-30 LAB — URINALYSIS, COMPLETE (UACMP) WITH MICROSCOPIC
BACTERIA UA: NONE SEEN
BILIRUBIN URINE: NEGATIVE
GLUCOSE, UA: NEGATIVE mg/dL
Ketones, ur: NEGATIVE mg/dL
LEUKOCYTES UA: NEGATIVE
Nitrite: NEGATIVE
PROTEIN: NEGATIVE mg/dL
Specific Gravity, Urine: 1.015 (ref 1.005–1.030)
WBC, UA: NONE SEEN WBC/hpf (ref 0–5)
pH: 7 (ref 5.0–8.0)

## 2018-03-30 LAB — CBC
HCT: 45 % (ref 39.0–52.0)
Hemoglobin: 15.9 g/dL (ref 13.0–17.0)
MCH: 31.3 pg (ref 26.0–34.0)
MCHC: 35.3 g/dL (ref 30.0–36.0)
MCV: 88.6 fL (ref 80.0–100.0)
PLATELETS: 226 10*3/uL (ref 150–400)
RBC: 5.08 MIL/uL (ref 4.22–5.81)
RDW: 12 % (ref 11.5–15.5)
WBC: 8 10*3/uL (ref 4.0–10.5)
nRBC: 0 % (ref 0.0–0.2)

## 2018-03-30 MED ORDER — TRAMADOL HCL 50 MG PO TABS: 50.0000 mg | ORAL_TABLET | Freq: Four times a day (QID) | ORAL | 0 refills | Status: AC | PRN

## 2018-03-30 MED ORDER — PREDNISONE 10 MG PO TABS: 10.0000 mg | ORAL_TABLET | Freq: Every day | ORAL | 0 refills | Status: AC

## 2018-03-30 MED ORDER — CYCLOBENZAPRINE HCL 5 MG PO TABS: 5.0000 mg | ORAL_TABLET | Freq: Three times a day (TID) | ORAL | 0 refills | Status: AC | PRN

## 2018-03-30 NOTE — Discharge Instructions (Addendum)
Please take medications as prescribed.  If any increasing pain, nausea, vomiting, fevers, worsening symptoms or changes in your health return to the emergency department or urgent care facility.

## 2018-03-30 NOTE — ED Triage Notes (Signed)
Per patient right side back / rib pain x yesterday. Patient deny any injury to his back. Patient stated breathing deeply help with the pain.

## 2018-03-30 NOTE — ED Provider Notes (Signed)
[facility]    CSN: [id number] Arrival date & time: 03/30/18  5284     History   Chief Complaint Chief Complaint  Patient presents with  . Back Pain    HPI Jon Ochoa is a 72 y.o. male presents to the emergency department for evaluation of midthoracic back pain.  Thoracic back pain began yesterday with no trauma or injury.  Initially pain yesterday felt like a muscle spasm.  He points just to the right of the midline of the thoracic spine along T8-T9.  He states yesterday evening the pain became constant, he was able to lay down and get some rest without any discomfort.  This morning upon awakening, patient describes constant aching pain and discomfort.  He was able to get relief with a hot shower this morning but this relief only lasted for 5 minutes.  Pain is sharp and increased with movement.  Pain radiates to the right lateral ribs.  He denies any abdominal pain, nausea, vomiting, fevers.  No difficulty with urination or hematuria.  Patient has a history of cholecystectomy.  He also notes history of numerous kidney stones, states this does not feel like a kidney stone as he has no associated nausea and onset was different.  Patient has not take any medications for his pain.  HPI  Past Medical History:  Diagnosis Date  . Hodgkin lymphoma ([place])   . Kidney stone   . Rotator cuff tear    left    There are no active problems to display for this patient.   Past Surgical History:  Procedure Laterality Date  . COLONOSCOPY WITH PROPOFOL N/A 10/27/2016   Procedure: COLONOSCOPY WITH PROPOFOL;  Surgeon: Lollie Sails, MD;  Location: [facility] ENDOSCOPY;  Service: Endoscopy;  Laterality: N/A;  . EXTRACORPOREAL SHOCK WAVE LITHOTRIPSY Right 03/29/2017   Procedure: EXTRACORPOREAL SHOCK WAVE LITHOTRIPSY (ESWL);  Surgeon: Royston Cowper, MD;  Location: [facility] ORS;  Service: Urology;  Laterality: Right;  . EXTRACORPOREAL SHOCK WAVE LITHOTRIPSY Left 05/03/2017   Procedure:  EXTRACORPOREAL SHOCK WAVE LITHOTRIPSY (ESWL);  Surgeon: Royston Cowper, MD;  Location: [facility] ORS;  Service: Urology;  Laterality: Left;       Home Medications    Prior to Admission medications   Medication Sig Start Date End Date Taking? Authorizing Provider  cyclobenzaprine (FLEXERIL) 5 MG tablet Take 1-2 tablets (5-10 mg total) by mouth 3 (three) times daily as needed for muscle spasms. 03/30/18   Duanne Guess, PA-C  NUCYNTA 50 MG tablet Take 1 tablet (50 mg total) by mouth every 6 (six) hours as needed for moderate pain. 1 TO 2 TABS Q 6 HOURS PRN PAIN 05/03/17   Royston Cowper, MD  ondansetron (ZOFRAN ODT) 8 MG disintegrating tablet Take 0.5 tablets (4 mg total) by mouth every 6 (six) hours as needed for nausea or vomiting. 05/03/17   Royston Cowper, MD  predniSONE (DELTASONE) 10 MG tablet Take 1 tablet (10 mg total) by mouth daily. 6,5,4,3,2,1 six day taper 03/30/18   Duanne Guess, PA-C  tamsulosin (FLOMAX) 0.4 MG CAPS capsule Take 1 capsule (0.4 mg total) by mouth daily. Patient not taking: Reported on 05/03/2017 03/29/17   Royston Cowper, MD  tamsulosin (FLOMAX) 0.4 MG CAPS capsule Take 1 capsule (0.4 mg total) by mouth daily. 05/03/17   Royston Cowper, MD  tapentadol (NUCYNTA) 50 MG tablet Take 1 tablet (50 mg total) by mouth every 6 (six) hours as needed. 1 TO 2 TABS Q  6 HOURS PRN PAIN Patient not taking: Reported on 05/03/2017 03/29/17   Royston Cowper, MD  traMADol (ULTRAM) 50 MG tablet Take 1 tablet (50 mg total) by mouth every 6 (six) hours as needed. 03/30/18   Duanne Guess, PA-C    Family History History reviewed. No pertinent family history.  Social History Social History   Tobacco Use  . Smoking status: Never Smoker  . Smokeless tobacco: Never Used  Substance Use Topics  . Alcohol use: No  . Drug use: No     Allergies   Patient has no known allergies.   Review of Systems Review of Systems  Constitutional: Negative for fatigue and  fever.  Respiratory: Negative for chest tightness and shortness of breath.   Cardiovascular: Negative for chest pain and leg swelling.  Gastrointestinal: Negative for abdominal distention, abdominal pain, diarrhea, nausea and vomiting.  Genitourinary: Negative for decreased urine volume, difficulty urinating, dysuria and flank pain.  Musculoskeletal: Positive for back pain. Negative for neck pain.  Skin: Negative for rash and wound.  Neurological: Negative for headaches.     Physical Exam Triage Vital Signs ED Triage Vitals  Enc Vitals Group     BP 03/30/18 0850 124/74     Pulse Rate 03/30/18 0850 60     Resp 03/30/18 0850 16     Temp 03/30/18 0850 97.7 F (36.5 C)     Temp Source 03/30/18 0850 Oral     SpO2 03/30/18 0850 100 %     Weight 03/30/18 0852 150 lb (68 kg)     Height 03/30/18 0852 5\' 8"  (1.727 m)     Head Circumference --      Peak Flow --      Pain Score 03/30/18 0852 7     Pain Loc --      Pain Edu? --      Excl. in [place]? --    No data found.  Updated Vital Signs BP 124/74 (BP Location: Left Arm)   Pulse 60   Temp 97.7 F (36.5 C) (Oral)   Resp 16   Ht 5\' 8"  (1.727 m)   Wt 150 lb (68 kg)   SpO2 100%   BMI 22.81 kg/m   Visual Acuity Right Eye Distance:   Left Eye Distance:   Bilateral Distance:    Right Eye Near:   Left Eye Near:    Bilateral Near:     Physical Exam  Constitutional: He is oriented to person, place, and time. He appears well-developed and well-nourished. No distress.  Ambulatory with no assistive devices.  HENT:  Head: Normocephalic and atraumatic.  Eyes: Conjunctivae are normal.  Neck: Normal range of motion.  Cardiovascular: Normal rate and regular rhythm.  Pulmonary/Chest: Effort normal and breath sounds normal. No stridor. No respiratory distress. He has no wheezes.  Abdominal: Soft. Bowel sounds are normal. He exhibits no distension and no mass. There is no tenderness. There is no rebound and no guarding.  Musculoskeletal:  Normal range of motion.  Examination of the thoracic and lumbar spine shows mild tenderness to percussion along the lower thoracic spine and slightly to the right along the paravertebral muscles.  There is no skin breakdown noted, warmth, redness or soft tissue swelling.  No palpable muscle spasms.  Patient has pain with movement.  No rib or flank pain with palpation.  Neurological: He is alert and oriented to person, place, and time.  Skin: Skin is warm. No rash noted.  Psychiatric: He has a  normal mood and affect. His behavior is normal. Thought content normal.  Nursing note and vitals reviewed.    UC Treatments / Results  Labs (all labs ordered are listed, but only abnormal results are displayed) Labs Reviewed  URINALYSIS, COMPLETE (UACMP) WITH MICROSCOPIC - Abnormal; Notable for the following components:      Result Value   Hgb urine dipstick TRACE (*)    All other components within normal limits  COMPREHENSIVE METABOLIC PANEL - Abnormal; Notable for the following components:   Glucose, Bld 106 (*)    Total Bilirubin 1.3 (*)    All other components within normal limits  CBC    EKG None  Radiology Dg Thoracic Spine 2 View  Result Date: 03/30/2018 CLINICAL DATA:  Acute lower thoracic pain since yesterday. No known trauma. EXAM: THORACIC SPINE 2 VIEWS COMPARISON:  None. 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 No fracture or traumatic malalignment noted. IMPRESSION: Degenerative changes.  No other acute abnormalities. Electronically Signed   By: Dorise Bullion III M.D   On: 03/30/2018 09:39    Procedures Procedures (including critical care time)  Medications Ordered in UC Medications - No data to display  Initial Impression / Assessment and Plan / UC Course  I have reviewed the triage vital signs and the nursing notes.  Pertinent labs & imaging results that were available during my care of the patient were reviewed by me and  considered in my medical decision making (see chart for details).     72 year old male with acute midthoracic back pain along the midline and right paravertebral muscles of the thoracic spine.  Pain has been moderate.  Checked thoracic spine x-ray showing no acute compression fracture he does have degenerative disc changes with arthritic changes.  Urinalysis obtained ruling out any type of infection or signs of kidney stone as there is no blood or RBCs noted.  CBC and CMP are both normal.  Patient's vital signs are stable.  Patient will start 6-day steroid taper, Flexeril and tramadol.  He understands signs and symptoms return to the urgent care emergency department for. Final Clinical Impressions(s) / UC Diagnoses   Final diagnoses:  Muscle spasm of back  Acute right-sided thoracic back pain     Discharge Instructions     Please take medications as prescribed.  If any increasing pain, nausea, vomiting, fevers, worsening symptoms or changes in your health return to the emergency department or urgent care facility.    ED Prescriptions    Medication Sig Dispense Auth. Provider   predniSONE (DELTASONE) 10 MG tablet Take 1 tablet (10 mg total) by mouth daily. 6,5,4,3,2,1 six day taper 21 tablet Duanne Guess, PA-C   traMADol (ULTRAM) 50 MG tablet Take 1 tablet (50 mg total) by mouth every 6 (six) hours as needed. 15 tablet Dorise Hiss C, PA-C   cyclobenzaprine (FLEXERIL) 5 MG tablet Take 1-2 tablets (5-10 mg total) by mouth 3 (three) times daily as needed for muscle spasms. 20 tablet Duanne Guess, PA-C     Controlled Substance Prescriptions [place] Controlled Substance Registry consulted? Yes, I have consulted the [place] Controlled Substances Registry for this patient, and feel the risk/benefit ratio today is favorable for proceeding with this prescription for a controlled substance.   Duanne Guess, PA-C 03/30/18 1028
